# Patient Record
Sex: Female | Born: 1950
Health system: Southern US, Community
[De-identification: ages and names within clinical notes are randomized; demographics above are authoritative.]

## PROBLEM LIST (undated history)

## (undated) DIAGNOSIS — I10 Essential (primary) hypertension: Secondary | ICD-10-CM

---

## 2004-08-30 ENCOUNTER — Ambulatory Visit: Payer: Self-pay | Admitting: Obstetrics and Gynecology

## 2008-08-10 ENCOUNTER — Ambulatory Visit: Payer: Self-pay | Admitting: Obstetrics and Gynecology

## 2008-08-13 ENCOUNTER — Ambulatory Visit: Payer: Self-pay | Admitting: Obstetrics and Gynecology

## 2009-08-25 ENCOUNTER — Ambulatory Visit: Payer: Self-pay | Admitting: Obstetrics and Gynecology

## 2010-08-29 ENCOUNTER — Ambulatory Visit: Payer: Self-pay | Admitting: Obstetrics and Gynecology

## 2011-08-31 ENCOUNTER — Ambulatory Visit: Payer: Self-pay | Admitting: Obstetrics and Gynecology

## 2012-09-16 ENCOUNTER — Ambulatory Visit: Payer: Self-pay | Admitting: Obstetrics and Gynecology

## 2013-09-29 ENCOUNTER — Ambulatory Visit: Payer: Self-pay | Admitting: Obstetrics and Gynecology

## 2013-10-05 ENCOUNTER — Ambulatory Visit: Payer: Self-pay | Admitting: Obstetrics and Gynecology

## 2014-10-12 ENCOUNTER — Ambulatory Visit: Payer: Self-pay | Admitting: Internal Medicine

## 2015-03-03 ENCOUNTER — Observation Stay
Admit: 2015-03-03 | Discharge: 2015-03-03 | Disposition: A | Discharge status: Home / Self Care | Payer: PPO | Attending: Internal Medicine | Admitting: Internal Medicine

## 2015-03-03 ENCOUNTER — Encounter: Payer: Self-pay | Admitting: Emergency Medicine

## 2015-03-03 ENCOUNTER — Observation Stay
Admission: EM | Admit: 2015-03-03 | Discharge: 2015-03-04 | Disposition: A | Discharge status: Home / Self Care | Payer: PPO | Attending: Internal Medicine | Admitting: Internal Medicine

## 2015-03-03 ENCOUNTER — Emergency Department: Payer: PPO

## 2015-03-03 DIAGNOSIS — R4701 Aphasia: Secondary | ICD-10-CM | POA: Insufficient documentation

## 2015-03-03 DIAGNOSIS — R200 Anesthesia of skin: Secondary | ICD-10-CM | POA: Diagnosis not present

## 2015-03-03 DIAGNOSIS — G459 Transient cerebral ischemic attack, unspecified: Secondary | ICD-10-CM | POA: Diagnosis present

## 2015-03-03 DIAGNOSIS — I739 Peripheral vascular disease, unspecified: Secondary | ICD-10-CM | POA: Insufficient documentation

## 2015-03-03 DIAGNOSIS — Z885 Allergy status to narcotic agent status: Secondary | ICD-10-CM | POA: Diagnosis not present

## 2015-03-03 DIAGNOSIS — M1990 Unspecified osteoarthritis, unspecified site: Secondary | ICD-10-CM | POA: Diagnosis not present

## 2015-03-03 DIAGNOSIS — I639 Cerebral infarction, unspecified: Secondary | ICD-10-CM | POA: Diagnosis present

## 2015-03-03 DIAGNOSIS — Z791 Long term (current) use of non-steroidal anti-inflammatories (NSAID): Secondary | ICD-10-CM | POA: Insufficient documentation

## 2015-03-03 DIAGNOSIS — R51 Headache: Secondary | ICD-10-CM | POA: Diagnosis not present

## 2015-03-03 DIAGNOSIS — R7309 Other abnormal glucose: Secondary | ICD-10-CM | POA: Diagnosis not present

## 2015-03-03 DIAGNOSIS — I6782 Cerebral ischemia: Principal | ICD-10-CM | POA: Insufficient documentation

## 2015-03-03 DIAGNOSIS — R29810 Facial weakness: Secondary | ICD-10-CM | POA: Diagnosis not present

## 2015-03-03 DIAGNOSIS — R4781 Slurred speech: Secondary | ICD-10-CM | POA: Diagnosis not present

## 2015-03-03 DIAGNOSIS — I10 Essential (primary) hypertension: Secondary | ICD-10-CM | POA: Diagnosis not present

## 2015-03-03 HISTORY — DX: Essential (primary) hypertension: I10

## 2015-03-03 LAB — DIFFERENTIAL
BASOS ABS: 0.1 10*3/uL (ref 0–0.1)
BASOS PCT: 1 %
Eosinophils Absolute: 0.2 10*3/uL (ref 0–0.7)
Eosinophils Relative: 3 %
Lymphocytes Relative: 24 %
Lymphs Abs: 1.7 10*3/uL (ref 1.0–3.6)
MONOS PCT: 7 %
Monocytes Absolute: 0.5 10*3/uL (ref 0.2–0.9)
NEUTROS PCT: 65 %
Neutro Abs: 4.7 10*3/uL (ref 1.4–6.5)

## 2015-03-03 LAB — LIPID PANEL
Cholesterol: 251 mg/dL — ABNORMAL HIGH (ref 0–200)
HDL: 48 mg/dL (ref >40)
LDL Cholesterol: 175 mg/dL — ABNORMAL HIGH (ref 0–99)
TRIGLYCERIDES: 139 mg/dL (ref <150)
Total CHOL/HDL Ratio: 5.2 RATIO
VLDL: 28 mg/dL (ref 0–40)

## 2015-03-03 LAB — CBC
HEMATOCRIT: 42 % (ref 35.0–47.0)
Hemoglobin: 14 g/dL (ref 12.0–16.0)
MCH: 28 pg (ref 26.0–34.0)
MCHC: 33.5 g/dL (ref 32.0–36.0)
MCV: 83.8 fL (ref 80.0–100.0)
Platelets: 188 10*3/uL (ref 150–440)
RBC: 5.01 MIL/uL (ref 3.80–5.20)
RDW: 13.8 % (ref 11.5–14.5)
WBC: 7.1 10*3/uL (ref 3.6–11.0)

## 2015-03-03 LAB — PROTIME-INR
INR: 1.04
Prothrombin Time: 13.8 seconds (ref 11.4–15.0)

## 2015-03-03 LAB — COMPREHENSIVE METABOLIC PANEL
ALT: 30 U/L (ref 14–54)
AST: 25 U/L (ref 15–41)
Albumin: 4.3 g/dL (ref 3.5–5.0)
Alkaline Phosphatase: 87 U/L (ref 38–126)
Anion gap: 9 (ref 5–15)
BUN: 21 mg/dL — ABNORMAL HIGH (ref 6–20)
CALCIUM: 9.9 mg/dL (ref 8.9–10.3)
CO2: 30 mmol/L (ref 22–32)
Chloride: 101 mmol/L (ref 101–111)
Creatinine, Ser: 1.27 mg/dL — ABNORMAL HIGH (ref 0.44–1.00)
GFR calc Af Amer: 50 mL/min — ABNORMAL LOW (ref >60)
GFR calc non Af Amer: 43 mL/min — ABNORMAL LOW (ref >60)
Glucose, Bld: 131 mg/dL — ABNORMAL HIGH (ref 65–99)
Potassium: 3.8 mmol/L (ref 3.5–5.1)
SODIUM: 140 mmol/L (ref 135–145)
TOTAL PROTEIN: 7.4 g/dL (ref 6.5–8.1)
Total Bilirubin: 0.8 mg/dL (ref 0.3–1.2)

## 2015-03-03 LAB — TROPONIN I: Troponin I: <0.03 ng/mL (ref <0.031)

## 2015-03-03 LAB — APTT: APTT: 33 s (ref 24–36)

## 2015-03-03 MED ORDER — SODIUM CHLORIDE 0.9 % IJ SOLN
3.0000 mL | Freq: Two times a day (BID) | INTRAMUSCULAR | Status: DC
  Administered 2015-03-03 – 2015-03-04 (×2): 3 mL via INTRAVENOUS

## 2015-03-03 MED ORDER — PNEUMOCOCCAL VAC POLYVALENT 25 MCG/0.5ML IJ INJ
0.5000 mL | INJECTION | INTRAMUSCULAR | Status: DC
  Filled 2015-03-03: qty 0.5

## 2015-03-03 MED ORDER — ASPIRIN EC 81 MG PO TBEC: 81.0000 mg | DELAYED_RELEASE_TABLET | Freq: Every day | ORAL | Status: DC

## 2015-03-03 MED ORDER — ASPIRIN 81 MG PO CHEW
324.0000 mg | CHEWABLE_TABLET | Freq: Once | ORAL | Status: AC
  Administered 2015-03-03: 12:00:00 via ORAL

## 2015-03-03 MED ORDER — SPIRONOLACTONE-HCTZ 25-25 MG PO TABS: 1.0000 | ORAL_TABLET | Freq: Every day | ORAL | Status: DC

## 2015-03-03 MED ORDER — SENNOSIDES-DOCUSATE SODIUM 8.6-50 MG PO TABS
1.0000 | ORAL_TABLET | Freq: Every evening | ORAL | Status: DC | PRN
Start: 2015-03-03 — End: 2015-03-04

## 2015-03-03 MED ORDER — HEPARIN SODIUM (PORCINE) 5000 UNIT/ML IJ SOLN: 5000.0000 [IU] | Freq: Three times a day (TID) | INTRAMUSCULAR | Status: DC

## 2015-03-03 MED ORDER — ACETAMINOPHEN 650 MG RE SUPP: 650.0000 mg | Freq: Four times a day (QID) | RECTAL | Status: DC | PRN

## 2015-03-03 MED ORDER — ACETAMINOPHEN 325 MG PO TABS: 650.0000 mg | ORAL_TABLET | Freq: Four times a day (QID) | ORAL | Status: DC | PRN

## 2015-03-03 MED ORDER — SODIUM CHLORIDE 0.9 % IJ SOLN
3.0000 mL | Freq: Two times a day (BID) | INTRAMUSCULAR | Status: DC
  Administered 2015-03-03: 3 mL via INTRAVENOUS

## 2015-03-03 MED ORDER — SIMVASTATIN 20 MG PO TABS: 20.0000 mg | ORAL_TABLET | Freq: Every day | ORAL | Status: DC

## 2015-03-03 MED ORDER — SPIRONOLACTONE 25 MG PO TABS
25.0000 mg | ORAL_TABLET | Freq: Every day | ORAL | Status: DC
  Administered 2015-03-04: 25 mg via ORAL
  Filled 2015-03-03: qty 1

## 2015-03-03 MED ORDER — ONDANSETRON HCL 4 MG PO TABS: 4.0000 mg | ORAL_TABLET | Freq: Four times a day (QID) | ORAL | Status: DC | PRN

## 2015-03-03 MED ORDER — HEPARIN SODIUM (PORCINE) 5000 UNIT/ML IJ SOLN
5000.0000 [IU] | Freq: Three times a day (TID) | INTRAMUSCULAR | Status: DC
  Administered 2015-03-03 – 2015-03-04 (×3): 5000 [IU] via SUBCUTANEOUS
  Filled 2015-03-03 (×3): qty 1

## 2015-03-03 MED ORDER — ASPIRIN 81 MG PO CHEW
CHEWABLE_TABLET | ORAL | Status: AC
  Filled 2015-03-03: qty 4

## 2015-03-03 MED ORDER — HYDROCHLOROTHIAZIDE 25 MG PO TABS
25.0000 mg | ORAL_TABLET | Freq: Every day | ORAL | Status: DC
  Administered 2015-03-04: 25 mg via ORAL
  Filled 2015-03-03: qty 1

## 2015-03-03 MED ORDER — ONDANSETRON HCL 4 MG/2ML IJ SOLN: 4.0000 mg | Freq: Four times a day (QID) | INTRAMUSCULAR | Status: DC | PRN

## 2015-03-03 MED ORDER — INSULIN ASPART 100 UNIT/ML ~~LOC~~ SOLN
0.0000 [IU] | Freq: Three times a day (TID) | SUBCUTANEOUS | Status: DC
  Filled 2015-03-03: qty 2

## 2015-03-03 MED ORDER — SENNOSIDES-DOCUSATE SODIUM 8.6-50 MG PO TABS: 1.0000 | ORAL_TABLET | Freq: Every evening | ORAL | Status: DC | PRN

## 2015-03-03 MED ORDER — ASPIRIN EC 81 MG PO TBEC
81.0000 mg | DELAYED_RELEASE_TABLET | Freq: Every day | ORAL | Status: DC
  Administered 2015-03-04: 81 mg via ORAL
  Filled 2015-03-03: qty 1

## 2015-03-03 NOTE — ED Provider Notes (Signed)
[facility] Emergency Department Provider Note  ____________________________________________  Time seen: Approximately 11:01 AM  I have reviewed the triage vital signs and the nursing notes.   HISTORY  Chief Complaint Cerebrovascular Accident    HPI KEMA SANTAELLA is a 64 y.o. female with history of hypertension presents for evaluation of word-finding difficulties and left facial droop which began suddenly at 9:30 this morning. She also reported facial numbness. No weakness in the extremities. Currently her symptoms are improving. Initial severity 10 out of 10.No recent illness including no cough, sneezing, runny nose, congestion. No chest pain or difficulty breathing. No modifying factors.   Past Medical History  Diagnosis Date  . Hypertension     There are no active problems to display for this patient.   History reviewed. No pertinent past surgical history.  Current Outpatient Rx  Name  Route  Sig  Dispense  Refill  . ibuprofen (ADVIL,MOTRIN) 800 MG tablet   Oral   Take 800 mg by mouth every 8 (eight) hours as needed.         . meloxicam (MOBIC) 7.5 MG tablet   Oral   Take 7.5 mg by mouth daily.         Marland Kitchen spironolactone-hydrochlorothiazide (ALDACTAZIDE) 25-25 MG per tablet   Oral   Take 1 tablet by mouth daily.           Allergies Codeine  History reviewed. No pertinent family history.  Social History History  Substance Use Topics  . Smoking status: Never Smoker   . Smokeless tobacco: Not on file  . Alcohol Use: No    Review of Systems Constitutional: No fever/chills Eyes: No visual changes. ENT: No sore throat. Cardiovascular: Denies chest pain. Respiratory: Denies shortness of breath. Gastrointestinal: No abdominal pain.  No nausea, no vomiting.  No diarrhea.  No constipation. Genitourinary: Negative for dysuria. Musculoskeletal: Negative for back pain. Skin: Negative for rash. Neurological: + for mild  headache  10-point ROS otherwise negative.  ____________________________________________   PHYSICAL EXAM:  VITAL SIGNS: ED Triage Vitals  Enc Vitals Group     BP 03/03/15 1013 157/81 mmHg     Pulse Rate 03/03/15 1013 61     Resp 03/03/15 1013 18     Temp --      Temp src --      SpO2 03/03/15 1013 99 %     Weight 03/03/15 1013 220 lb (99.791 kg)     Height 03/03/15 1013 5\' 1"  (1.549 m)     Head Cir --      Peak Flow --      Pain Score 03/03/15 1015 3     Pain Loc --      Pain Edu? --      Excl. in [place]? --     Constitutional: Alert and oriented. Well appearing and in no acute distress. Eyes: Conjunctivae are normal. PERRL. EOMI. Head: Atraumatic. Nose: No congestion/rhinnorhea. Mouth/Throat: Mucous membranes are moist.  Oropharynx non-erythematous. Neck: No stridor.   Hematological/Lymphatic/Immunilogical: No cervical lymphadenopathy. Cardiovascular: Normal rate, regular rhythm. Grossly normal heart sounds.  Good peripheral circulation. Respiratory: Normal respiratory effort.  No retractions. Lungs CTAB. Gastrointestinal: Soft and nontender. No distention. No abdominal bruits. No CVA tenderness. Genitourinary: deferred Musculoskeletal: No lower extremity tenderness nor edema.  No joint effusions. Neurologic:  Normal speech and language.  Speech is normal. 5 out of 5 strength in bilateral upper and lower extremities, sensation intact to light touch throughout, normal finger-nose-finger, no aphasia, minimal  left lower facial droop/facial asymmetry but cranial nerves otherwise intact Skin:  Skin is warm, dry and intact. No rash noted. Psychiatric: Mood and affect are normal. Speech and behavior are normal.  ____________________________________________   LABS (all labs ordered are listed, but only abnormal results are displayed)  Labs Reviewed  COMPREHENSIVE METABOLIC PANEL - Abnormal; Notable for the following:    Glucose, Bld 131 (*)    BUN 21 (*)    Creatinine, Ser  1.27 (*)    GFR calc non Af Amer 43 (*)    GFR calc Af Amer 50 (*)    All other components within normal limits  PROTIME-INR  APTT  CBC  DIFFERENTIAL  TROPONIN I   ____________________________________________  EKG  ED ECG REPORT   Date: 03/03/2015  EKG Time: 10:19  Rate: 58  Rhythm: Sinus bradycardia  Axis: Normal  Intervals:none  ST&T Change: None  ____________________________________________  RADIOLOGY  CT head IMPRESSION: 1. No evidence of acute intracranial abnormality. 2. Mild chronic small vessel ischemic disease. ____________________________________________   PROCEDURES  Procedure(s) performed: None  Critical Care performed: Yes. Total critical care time spent 40 minutes.  ____________________________________________   INITIAL IMPRESSION / ASSESSMENT AND PLAN / ED COURSE  Pertinent labs & imaging results that were available during my care of the patient were reviewed by me and considered in my medical decision making (see chart for details).  BELKIS NORBECK is a 64 y.o. female with history of hypertension presents for evaluation of word-finding difficulties and left facial droop which began suddenly at 9:30 this morning. At this point, this patient's speech difficulty is completely resolved, she is completely coherent, speaking with normal fluency. She does continue to have mild left lower facial droop/facial asymmetry but given NIH stroke scale of one, rapid improvement in symptoms, discussed with Dr. Tamala Julian of neurology who agrees we will not administer TPA at this time. Plan for basic labs and anticipate admission.   ----------------------------------------- 12:29 PM on 03/03/2015 -----------------------------------------  CT head negative. Patient passed her swallow screen. Aspirin ordered. Still with very mild left facial droop. Hospitalist will admit. ____________________________________________   FINAL CLINICAL IMPRESSION(S) / ED  DIAGNOSES  Final diagnoses:  CVA (cerebral vascular accident)      Joanne Gavel, MD 03/03/15 1446

## 2015-03-03 NOTE — H&P (Signed)
[facility] [place] NAME: Michele Rios    MR#:  [id number]  DATE OF BIRTH:  05/08/1951  DATE OF ADMISSION:  03/03/2015 PRIMARY CARE PHYSICIAN:Dr Emily Filbert   REQUESTING/REFERRING PHYSICIAN: Dr. Edd Fabian  CHIEF COMPLAINT:  Slurred speech and facial droop HISTORY OF PRESENT ILLNESS:  Michele Rios  is a 64 y.o. female with a known history of essential hypertension who presents today with above complaint. According to the patient she was outside doing yard work and then came inside to eat lunch. She went to answer the phone and as she was speaking she noticed that she was unable to get her words out and her sensorium was cloudy. Her family noted slurred speech as well as left facial droop. She's also noticed a dull headache over the past several days. She denies any vision changes or any focal deficits. Her symptoms had subsided prior to coming to the emergency room. Emergency department had CAT scan was performed which was negative for an acute stroke.  PAST MEDICAL HISTORY:   Past Medical History  Diagnosis Date  . Hypertension     PAST SURGICAL HISTOIRY:  History reviewed. No pertinent past surgical history.  SOCIAL HISTORY:   History  Substance Use Topics  . Smoking status: Never Smoker   . Smokeless tobacco: Not on file  . Alcohol Use: No    FAMILY HISTORY:  History reviewed. No pertinent family history.  DRUG ALLERGIES:   Allergies  Allergen Reactions  . Codeine Nausea And Vomiting    REVIEW OF SYSTEMS:  CONSTITUTIONAL: No fever, fatigue or weakness.  EYES: No blurred or double vision.  EARS, NOSE, AND THROAT: No tinnitus or ear pain.  RESPIRATORY: No cough, shortness of breath, wheezing or hemoptysis.  CARDIOVASCULAR: No chest pain, orthopnea, edema.  GASTROINTESTINAL: No nausea, vomiting, diarrhea or abdominal pain.  GENITOURINARY: No dysuria, hematuria.  ENDOCRINE: No polyuria, nocturia,  HEMATOLOGY: No  anemia, easy bruising or bleeding SKIN: No rash or lesion. MUSCULOSKELETAL: No joint pain or arthritis.   NEUROLOGIC: She had tingling around her mouth along with slurred speech and facial droop which is now resolved. She also had a dull headache for several days. PSYCHIATRY: No anxiety or depression.   MEDICATIONS AT HOME:   Prior to Admission medications   Medication Sig Start Date End Date Taking? Authorizing Provider  ibuprofen (ADVIL,MOTRIN) 800 MG tablet Take 800 mg by mouth every 8 (eight) hours as needed.   Yes Historical Provider, MD  meloxicam (MOBIC) 7.5 MG tablet Take 7.5 mg by mouth daily.   Yes Historical Provider, MD  spironolactone-hydrochlorothiazide (ALDACTAZIDE) 25-25 MG per tablet Take 1 tablet by mouth daily.   Yes Historical Provider, MD      VITAL SIGNS:  Blood pressure 114/52, pulse 58, resp. rate 12, height 5\' 1"  (1.549 m), weight 99.791 kg (220 lb), SpO2 100 %.  PHYSICAL EXAMINATION:  GENERAL:  64 y.o.-year-old patient lying in the bed with no acute distress.  EYES: Pupils equal, round, reactive to light and accommodation. No scleral icterus. Extraocular muscles intact.  HEENT: Head atraumatic, normocephalic. Oropharynx and nasopharynx clear.  NECK:  Supple, no jugular venous distention. No thyroid enlargement, no tenderness.  LUNGS: Normal breath sounds bilaterally, no wheezing, rales,rhonchi or crepitation. No use of accessory muscles of respiration.  CARDIOVASCULAR: S1, S2 normal. No murmurs, rubs, or gallops.  ABDOMEN: Soft, nontender, nondistended. Bowel sounds present. No organomegaly or mass.  EXTREMITIES: No pedal edema, cyanosis, or clubbing.  NEUROLOGIC: Cranial nerves II through XII are intact. Muscle strength 5/5 in all extremities. Sensation intact. Gait not checked.  PSYCHIATRIC: The patient is alert and oriented x 3.  SKIN: No obvious rash, lesion, or ulcer.   LABORATORY PANEL:   CBC  Recent Labs Lab 03/03/15 1033  WBC 7.1  HGB 14.0   HCT 42.0  PLT 188   ------------------------------------------------------------------------------------------------------------------  Chemistries   Recent Labs Lab 03/03/15 1033  NA 140  K 3.8  CL 101  CO2 30  GLUCOSE 131*  BUN 21*  CREATININE 1.27*  CALCIUM 9.9  AST 25  ALT 30  ALKPHOS 87  BILITOT 0.8   ------------------------------------------------------------------------------------------------------------------  Cardiac Enzymes  Recent Labs Lab 03/03/15 1023  TROPONINI <0.03   ------------------------------------------------------------------------------------------------------------------  RADIOLOGY:  Ct Head Wo Contrast  03/03/2015  .  IMPRESSION: 1. No evidence of acute intracranial abnormality. 2. Mild chronic small vessel ischemic disease.   Electronically Signed   By: Logan Bores   On: 03/03/2015 11:30    EKG:   Sinus brady HR 58 no st elevations  IMPRESSION AND PLAN:  This is 64 year old female who presents with slurred speech left facial droop and inability to speak and now all of her symptoms have resolved.   1. TIA: Patient's symptoms are likely secondary to TIA. Her symptoms have now subsided. Patient is aspirin nave. I will start aspirin and statin therapy. I will check fasting lipids in a.m. Neuro checks will be ordered every 4 hours. I have ordered an MRI, echocardiogram, and carotid Dopplers as part of the stroke protocol. Further management depending upon these results. At this time patient does not need speech therapy or physical therapy as her symptoms have resolved.  2. Essential hypertension: Patient will continue on outpatient occasions.   3. Prediabetes: I will place her on SSI and ADA diet.  All the records are reviewed and case discussed with ED provider. Management plans discussed with the patient and family and they are in agreement.  CODE STATUS: FULL   TOTAL TIME TAKING CARE OF THIS PATIENT: 45 minutes.    Amanuel Sinkfield,  Petronella Shuford M.D on 03/03/2015 at 12:51 PM  Between 7am to 6pm - Pager - [phone] After 6pm go to [email or web address] - password [email or web address] [facility]  [facility]  Office  [phone]  CC: Primary care physician; No primary care provider on file.

## 2015-03-03 NOTE — ED Notes (Signed)
MD in with pt

## 2015-03-03 NOTE — ED Notes (Signed)
Patient resting comfortably on stretcher. Denies any symptoms. Alert and oriented. Family at bedside. Awaiting room assignment.

## 2015-03-03 NOTE — ED Notes (Signed)
Patient transported to CT with RN 

## 2015-03-03 NOTE — ED Notes (Signed)
States her speech became slurred and she felt funny..face is numbness and headche

## 2015-03-03 NOTE — ED Notes (Signed)
Pt returned from CT, family at bedside.

## 2015-03-03 NOTE — ED Notes (Signed)
Pt tearful at bedside, family in room

## 2015-03-04 ENCOUNTER — Observation Stay: Payer: PPO

## 2015-03-04 LAB — BASIC METABOLIC PANEL
Anion gap: 8 (ref 5–15)
BUN: 23 mg/dL — ABNORMAL HIGH (ref 6–20)
CO2: 30 mmol/L (ref 22–32)
CREATININE: 1.24 mg/dL — AB (ref 0.44–1.00)
Calcium: 9.6 mg/dL (ref 8.9–10.3)
Chloride: 102 mmol/L (ref 101–111)
GFR, EST AFRICAN AMERICAN: 52 mL/min — AB (ref >60)
GFR, EST NON AFRICAN AMERICAN: 45 mL/min — AB (ref >60)
GLUCOSE: 120 mg/dL — AB (ref 65–99)
Potassium: 3.7 mmol/L (ref 3.5–5.1)
Sodium: 140 mmol/L (ref 135–145)

## 2015-03-04 LAB — GLUCOSE, CAPILLARY
GLUCOSE-CAPILLARY: 119 mg/dL — AB (ref 65–99)
Glucose-Capillary: 160 mg/dL — ABNORMAL HIGH (ref 65–99)

## 2015-03-04 MED ORDER — SIMVASTATIN 40 MG PO TABS: 40.0000 mg | ORAL_TABLET | Freq: Every day | ORAL | Status: AC

## 2015-03-04 MED ORDER — CLOPIDOGREL BISULFATE 75 MG PO TABS: 75.0000 mg | ORAL_TABLET | Freq: Every day | ORAL | Status: AC

## 2015-03-04 MED ORDER — ASPIRIN 81 MG PO TBEC: 81.0000 mg | DELAYED_RELEASE_TABLET | Freq: Every day | ORAL | Status: DC

## 2015-03-04 MED ORDER — SIMVASTATIN 40 MG PO TABS: 40.0000 mg | ORAL_TABLET | Freq: Every day | ORAL | Status: DC

## 2015-03-04 NOTE — Discharge Instructions (Signed)
No heavy lifting for 1 month

## 2015-03-04 NOTE — Progress Notes (Signed)
A&O. Denies pain. Independent. Denies any unilateral weakness. No s/s distress. Passed swallow eval. For MRI this AM.

## 2015-03-04 NOTE — Discharge Summary (Addendum)
                                                                                    Michele Rios, is a 64 y.o. female  DOB 1951-10-14  MRN [id number].  Admission date:  03/03/2015  Admitting Physician  Bettey Costa, MD  Discharge Date:  03/04/2015    Admission Diagnosis  CVA (cerebral vascular accident) [I63.9]  Discharge Diagnoses   Right PCA watershed infarcts Hyperlipidemia Osteoarthritis   Past Medical History  Diagnosis Date  . Hypertension     History reviewed. No pertinent past surgical history.     History of present illness and  Hospital Course:     Kindly see H&P for history of present illness and admission details, please review complete Labs, Consult reports and Test reports for all details in brief  HPI  from the history and physical done on the day of admission    Hospital Course    Patient was admitted with left facial droop, expressive aphasia and clouded sensorium. Initial brain CT showed no evidence of CVA. Blood work normal except LDL of 175 on lipid panel. Echocardiogram normal. Patient's symptoms resolved within 4 hours of presentation to the ER and she is currently asymptomatic. MRI showed 3 watershed infarcts in the right posterior cerebral perfusion area. Carotid shows no sig stenosis and Echocardiogram was normal. Telemetry showed only NSR.   Discharge Condition: Stable   Follow UP  Dr. Sabra Heck one week    Discharge Instructions  and  Discharge Medications   Plavix 75 mg daily Simvastatin 40 mg at bedtime Spironolactone/HCTZ 25/25 daily  Today   Subjective:   Michele Rios is asymptomatic post MRI  Objective:   Blood pressure 130/36, pulse 60, temperature 97.9 F (36.6 C), temperature source Oral, resp. rate 18, height 5' (1.524 m), weight 98.068 kg (216 lb 3.2 oz), SpO2 99 %.   Exam Awake Alert, Oriented x 3, No new F.N deficits, Normal affect [place].AT,PERRALNo neurologic deficits   Total Time in preparing paper work, data  evaluation and todays exam - 35 minutes  [place] F. M.D on 03/04/2015 at 8:08 AM

## 2015-03-17 ENCOUNTER — Other Ambulatory Visit: Payer: Self-pay | Admitting: Neurology

## 2015-03-17 DIAGNOSIS — I63439 Cerebral infarction due to embolism of unspecified posterior cerebral artery: Secondary | ICD-10-CM

## 2015-03-22 ENCOUNTER — Ambulatory Visit
Admission: RE | Admit: 2015-03-22 | Discharge: 2015-03-22 | Disposition: A | Discharge status: Home / Self Care | Payer: PPO | Source: Ambulatory Visit | Attending: Neurology | Admitting: Neurology

## 2015-03-22 DIAGNOSIS — I63439 Cerebral infarction due to embolism of unspecified posterior cerebral artery: Secondary | ICD-10-CM

## 2015-03-22 DIAGNOSIS — I671 Cerebral aneurysm, nonruptured: Secondary | ICD-10-CM | POA: Diagnosis not present

## 2015-03-25 ENCOUNTER — Ambulatory Visit: Payer: PPO

## 2015-09-13 ENCOUNTER — Other Ambulatory Visit: Payer: Self-pay | Admitting: Obstetrics and Gynecology

## 2015-09-13 DIAGNOSIS — Z1231 Encounter for screening mammogram for malignant neoplasm of breast: Secondary | ICD-10-CM

## 2015-09-14 ENCOUNTER — Ambulatory Visit: Payer: PPO | Attending: Obstetrics and Gynecology

## 2015-10-12 ENCOUNTER — Ambulatory Visit
Admission: RE | Admit: 2015-10-12 | Discharge: 2015-10-12 | Disposition: A | Discharge status: Home / Self Care | Payer: PPO | Source: Ambulatory Visit | Attending: Obstetrics and Gynecology | Admitting: Obstetrics and Gynecology

## 2015-10-12 ENCOUNTER — Other Ambulatory Visit: Payer: Self-pay | Admitting: Obstetrics and Gynecology

## 2015-10-12 DIAGNOSIS — Z1231 Encounter for screening mammogram for malignant neoplasm of breast: Secondary | ICD-10-CM

## 2016-03-12 ENCOUNTER — Other Ambulatory Visit: Payer: Self-pay | Admitting: Neurology

## 2016-03-12 DIAGNOSIS — I63419 Cerebral infarction due to embolism of unspecified middle cerebral artery: Secondary | ICD-10-CM

## 2016-03-12 DIAGNOSIS — I671 Cerebral aneurysm, nonruptured: Secondary | ICD-10-CM

## 2016-03-30 ENCOUNTER — Ambulatory Visit
Admission: RE | Admit: 2016-03-30 | Discharge: 2016-03-30 | Disposition: A | Discharge status: Home / Self Care | Payer: PPO | Source: Ambulatory Visit | Attending: Neurology | Admitting: Neurology

## 2016-03-30 DIAGNOSIS — I63419 Cerebral infarction due to embolism of unspecified middle cerebral artery: Secondary | ICD-10-CM

## 2016-03-30 DIAGNOSIS — I671 Cerebral aneurysm, nonruptured: Secondary | ICD-10-CM

## 2016-04-05 DIAGNOSIS — Z0000 Encounter for general adult medical examination without abnormal findings: Secondary | ICD-10-CM | POA: Diagnosis not present

## 2016-04-05 DIAGNOSIS — E119 Type 2 diabetes mellitus without complications: Secondary | ICD-10-CM | POA: Diagnosis not present

## 2016-04-12 DIAGNOSIS — E119 Type 2 diabetes mellitus without complications: Secondary | ICD-10-CM | POA: Diagnosis not present

## 2016-04-30 ENCOUNTER — Other Ambulatory Visit: Payer: Self-pay | Admitting: Orthopedic Surgery

## 2016-04-30 ENCOUNTER — Ambulatory Visit
Admission: RE | Admit: 2016-04-30 | Discharge: 2016-04-30 | Disposition: A | Discharge status: Home / Self Care | Payer: PPO | Source: Ambulatory Visit | Attending: Orthopedic Surgery | Admitting: Orthopedic Surgery

## 2016-04-30 ENCOUNTER — Other Ambulatory Visit: Payer: Self-pay | Admitting: Internal Medicine

## 2016-04-30 DIAGNOSIS — M2392 Unspecified internal derangement of left knee: Secondary | ICD-10-CM

## 2016-04-30 DIAGNOSIS — M25562 Pain in left knee: Secondary | ICD-10-CM | POA: Diagnosis not present

## 2016-08-03 DIAGNOSIS — Z23 Encounter for immunization: Secondary | ICD-10-CM | POA: Diagnosis not present

## 2016-09-03 ENCOUNTER — Other Ambulatory Visit: Payer: Self-pay | Admitting: Internal Medicine

## 2016-09-03 DIAGNOSIS — Z1231 Encounter for screening mammogram for malignant neoplasm of breast: Secondary | ICD-10-CM

## 2016-10-11 ENCOUNTER — Ambulatory Visit: Payer: PPO

## 2016-10-12 DIAGNOSIS — E119 Type 2 diabetes mellitus without complications: Secondary | ICD-10-CM | POA: Diagnosis not present

## 2016-10-16 ENCOUNTER — Ambulatory Visit
Admission: RE | Admit: 2016-10-16 | Discharge: 2016-10-16 | Disposition: A | Discharge status: Home / Self Care | Payer: PPO | Source: Ambulatory Visit | Attending: Internal Medicine | Admitting: Internal Medicine

## 2016-10-16 DIAGNOSIS — Z1231 Encounter for screening mammogram for malignant neoplasm of breast: Secondary | ICD-10-CM | POA: Insufficient documentation

## 2016-10-19 DIAGNOSIS — I63431 Cerebral infarction due to embolism of right posterior cerebral artery: Secondary | ICD-10-CM | POA: Diagnosis not present

## 2016-10-19 DIAGNOSIS — R7989 Other specified abnormal findings of blood chemistry: Secondary | ICD-10-CM | POA: Diagnosis not present

## 2016-10-19 DIAGNOSIS — Z0000 Encounter for general adult medical examination without abnormal findings: Secondary | ICD-10-CM | POA: Diagnosis not present

## 2016-10-19 DIAGNOSIS — E119 Type 2 diabetes mellitus without complications: Secondary | ICD-10-CM | POA: Diagnosis not present

## 2017-03-28 ENCOUNTER — Other Ambulatory Visit: Payer: Self-pay | Admitting: Internal Medicine

## 2017-03-28 DIAGNOSIS — I671 Cerebral aneurysm, nonruptured: Secondary | ICD-10-CM

## 2017-04-09 DIAGNOSIS — E119 Type 2 diabetes mellitus without complications: Secondary | ICD-10-CM | POA: Diagnosis not present

## 2017-04-09 DIAGNOSIS — Z0000 Encounter for general adult medical examination without abnormal findings: Secondary | ICD-10-CM | POA: Diagnosis not present

## 2017-04-09 DIAGNOSIS — R7989 Other specified abnormal findings of blood chemistry: Secondary | ICD-10-CM | POA: Diagnosis not present

## 2017-04-12 ENCOUNTER — Ambulatory Visit
Admission: RE | Admit: 2017-04-12 | Discharge: 2017-04-12 | Disposition: A | Discharge status: Home / Self Care | Payer: PPO | Source: Ambulatory Visit | Attending: Internal Medicine | Admitting: Internal Medicine

## 2017-04-12 DIAGNOSIS — I671 Cerebral aneurysm, nonruptured: Secondary | ICD-10-CM | POA: Diagnosis not present

## 2017-04-12 DIAGNOSIS — I672 Cerebral atherosclerosis: Secondary | ICD-10-CM | POA: Diagnosis not present

## 2017-04-16 DIAGNOSIS — Z0000 Encounter for general adult medical examination without abnormal findings: Secondary | ICD-10-CM | POA: Diagnosis not present

## 2017-04-16 DIAGNOSIS — R7989 Other specified abnormal findings of blood chemistry: Secondary | ICD-10-CM | POA: Diagnosis not present

## 2017-04-16 DIAGNOSIS — E119 Type 2 diabetes mellitus without complications: Secondary | ICD-10-CM | POA: Diagnosis not present

## 2017-04-16 DIAGNOSIS — E782 Mixed hyperlipidemia: Secondary | ICD-10-CM | POA: Diagnosis not present

## 2017-08-01 DIAGNOSIS — Z23 Encounter for immunization: Secondary | ICD-10-CM | POA: Diagnosis not present

## 2017-09-11 ENCOUNTER — Other Ambulatory Visit: Payer: Self-pay | Admitting: Internal Medicine

## 2017-09-11 DIAGNOSIS — Z1231 Encounter for screening mammogram for malignant neoplasm of breast: Secondary | ICD-10-CM

## 2017-10-17 DIAGNOSIS — E119 Type 2 diabetes mellitus without complications: Secondary | ICD-10-CM | POA: Diagnosis not present

## 2017-10-17 DIAGNOSIS — E782 Mixed hyperlipidemia: Secondary | ICD-10-CM | POA: Diagnosis not present

## 2017-10-18 ENCOUNTER — Ambulatory Visit
Admission: RE | Admit: 2017-10-18 | Discharge: 2017-10-18 | Disposition: A | Discharge status: Home / Self Care | Payer: PPO | Source: Ambulatory Visit | Attending: Internal Medicine | Admitting: Internal Medicine

## 2017-10-18 DIAGNOSIS — Z1231 Encounter for screening mammogram for malignant neoplasm of breast: Secondary | ICD-10-CM | POA: Insufficient documentation

## 2017-10-21 DIAGNOSIS — Z0000 Encounter for general adult medical examination without abnormal findings: Secondary | ICD-10-CM | POA: Diagnosis not present

## 2017-10-21 DIAGNOSIS — E782 Mixed hyperlipidemia: Secondary | ICD-10-CM | POA: Diagnosis not present

## 2017-10-21 DIAGNOSIS — E119 Type 2 diabetes mellitus without complications: Secondary | ICD-10-CM | POA: Diagnosis not present

## 2017-10-21 DIAGNOSIS — M818 Other osteoporosis without current pathological fracture: Secondary | ICD-10-CM | POA: Diagnosis not present

## 2018-03-05 ENCOUNTER — Other Ambulatory Visit: Payer: Self-pay | Admitting: Internal Medicine

## 2018-03-05 DIAGNOSIS — I671 Cerebral aneurysm, nonruptured: Secondary | ICD-10-CM

## 2018-03-31 ENCOUNTER — Ambulatory Visit: Payer: PPO

## 2018-04-09 ENCOUNTER — Ambulatory Visit
Admission: RE | Admit: 2018-04-09 | Discharge: 2018-04-09 | Disposition: A | Discharge status: Home / Self Care | Payer: PPO | Source: Ambulatory Visit | Attending: Internal Medicine | Admitting: Internal Medicine

## 2018-04-09 DIAGNOSIS — I671 Cerebral aneurysm, nonruptured: Secondary | ICD-10-CM | POA: Insufficient documentation

## 2018-04-09 DIAGNOSIS — E119 Type 2 diabetes mellitus without complications: Secondary | ICD-10-CM | POA: Diagnosis not present

## 2018-04-09 DIAGNOSIS — R7989 Other specified abnormal findings of blood chemistry: Secondary | ICD-10-CM | POA: Diagnosis not present

## 2018-04-09 DIAGNOSIS — M8588 Other specified disorders of bone density and structure, other site: Secondary | ICD-10-CM | POA: Diagnosis not present

## 2018-04-09 DIAGNOSIS — I672 Cerebral atherosclerosis: Secondary | ICD-10-CM | POA: Diagnosis not present

## 2018-04-09 DIAGNOSIS — E782 Mixed hyperlipidemia: Secondary | ICD-10-CM | POA: Diagnosis not present

## 2018-04-16 DIAGNOSIS — E1151 Type 2 diabetes mellitus with diabetic peripheral angiopathy without gangrene: Secondary | ICD-10-CM | POA: Diagnosis not present

## 2018-04-16 DIAGNOSIS — E538 Deficiency of other specified B group vitamins: Secondary | ICD-10-CM | POA: Diagnosis not present

## 2018-04-16 DIAGNOSIS — Z0000 Encounter for general adult medical examination without abnormal findings: Secondary | ICD-10-CM | POA: Diagnosis not present

## 2018-05-21 DIAGNOSIS — D239 Other benign neoplasm of skin, unspecified: Secondary | ICD-10-CM | POA: Diagnosis not present

## 2018-05-21 DIAGNOSIS — L920 Granuloma annulare: Secondary | ICD-10-CM | POA: Diagnosis not present

## 2018-08-14 DIAGNOSIS — Z23 Encounter for immunization: Secondary | ICD-10-CM | POA: Diagnosis not present

## 2018-09-24 ENCOUNTER — Other Ambulatory Visit: Payer: Self-pay | Admitting: Internal Medicine

## 2018-09-24 DIAGNOSIS — Z1231 Encounter for screening mammogram for malignant neoplasm of breast: Secondary | ICD-10-CM

## 2018-10-21 ENCOUNTER — Ambulatory Visit
Admission: RE | Admit: 2018-10-21 | Discharge: 2018-10-21 | Disposition: A | Discharge status: Home / Self Care | Payer: PPO | Source: Ambulatory Visit | Attending: Internal Medicine | Admitting: Internal Medicine

## 2018-10-21 DIAGNOSIS — Z1231 Encounter for screening mammogram for malignant neoplasm of breast: Secondary | ICD-10-CM | POA: Insufficient documentation

## 2018-10-21 DIAGNOSIS — E538 Deficiency of other specified B group vitamins: Secondary | ICD-10-CM | POA: Diagnosis not present

## 2018-10-21 DIAGNOSIS — E1151 Type 2 diabetes mellitus with diabetic peripheral angiopathy without gangrene: Secondary | ICD-10-CM | POA: Diagnosis not present

## 2018-10-24 DIAGNOSIS — Z0000 Encounter for general adult medical examination without abnormal findings: Secondary | ICD-10-CM | POA: Diagnosis not present

## 2018-10-24 DIAGNOSIS — I63431 Cerebral infarction due to embolism of right posterior cerebral artery: Secondary | ICD-10-CM | POA: Diagnosis not present

## 2018-10-24 DIAGNOSIS — E1151 Type 2 diabetes mellitus with diabetic peripheral angiopathy without gangrene: Secondary | ICD-10-CM | POA: Diagnosis not present

## 2019-03-11 DIAGNOSIS — R1031 Right lower quadrant pain: Secondary | ICD-10-CM | POA: Diagnosis not present

## 2019-03-11 DIAGNOSIS — R1032 Left lower quadrant pain: Secondary | ICD-10-CM | POA: Diagnosis not present

## 2019-03-11 DIAGNOSIS — R350 Frequency of micturition: Secondary | ICD-10-CM | POA: Diagnosis not present

## 2019-03-11 DIAGNOSIS — R6883 Chills (without fever): Secondary | ICD-10-CM | POA: Diagnosis not present

## 2019-04-20 DIAGNOSIS — E1151 Type 2 diabetes mellitus with diabetic peripheral angiopathy without gangrene: Secondary | ICD-10-CM | POA: Diagnosis not present

## 2019-04-27 DIAGNOSIS — E782 Mixed hyperlipidemia: Secondary | ICD-10-CM | POA: Diagnosis not present

## 2019-04-27 DIAGNOSIS — E1151 Type 2 diabetes mellitus with diabetic peripheral angiopathy without gangrene: Secondary | ICD-10-CM | POA: Diagnosis not present

## 2019-04-27 DIAGNOSIS — I63431 Cerebral infarction due to embolism of right posterior cerebral artery: Secondary | ICD-10-CM | POA: Diagnosis not present

## 2019-05-12 ENCOUNTER — Other Ambulatory Visit: Payer: Self-pay | Admitting: Internal Medicine

## 2019-05-12 DIAGNOSIS — I671 Cerebral aneurysm, nonruptured: Secondary | ICD-10-CM

## 2019-05-26 ENCOUNTER — Other Ambulatory Visit: Payer: Self-pay

## 2019-05-26 ENCOUNTER — Ambulatory Visit
Admission: RE | Admit: 2019-05-26 | Discharge: 2019-05-26 | Disposition: A | Discharge status: Home / Self Care | Payer: PPO | Source: Ambulatory Visit | Attending: Internal Medicine | Admitting: Internal Medicine

## 2019-05-26 DIAGNOSIS — I671 Cerebral aneurysm, nonruptured: Secondary | ICD-10-CM | POA: Diagnosis not present

## 2019-07-22 DIAGNOSIS — Z23 Encounter for immunization: Secondary | ICD-10-CM | POA: Diagnosis not present

## 2019-10-05 ENCOUNTER — Other Ambulatory Visit: Payer: Self-pay | Admitting: Internal Medicine

## 2019-10-05 DIAGNOSIS — Z1231 Encounter for screening mammogram for malignant neoplasm of breast: Secondary | ICD-10-CM

## 2019-10-21 DIAGNOSIS — E1151 Type 2 diabetes mellitus with diabetic peripheral angiopathy without gangrene: Secondary | ICD-10-CM | POA: Diagnosis not present

## 2019-10-28 DIAGNOSIS — Z0000 Encounter for general adult medical examination without abnormal findings: Secondary | ICD-10-CM | POA: Diagnosis not present

## 2019-10-28 DIAGNOSIS — Z1212 Encounter for screening for malignant neoplasm of rectum: Secondary | ICD-10-CM | POA: Diagnosis not present

## 2019-10-28 DIAGNOSIS — E782 Mixed hyperlipidemia: Secondary | ICD-10-CM | POA: Diagnosis not present

## 2019-10-28 DIAGNOSIS — E1151 Type 2 diabetes mellitus with diabetic peripheral angiopathy without gangrene: Secondary | ICD-10-CM | POA: Diagnosis not present

## 2019-10-29 ENCOUNTER — Ambulatory Visit
Admission: RE | Admit: 2019-10-29 | Discharge: 2019-10-29 | Disposition: A | Discharge status: Home / Self Care | Payer: PPO | Source: Ambulatory Visit | Attending: Internal Medicine | Admitting: Internal Medicine

## 2019-10-29 DIAGNOSIS — Z1231 Encounter for screening mammogram for malignant neoplasm of breast: Secondary | ICD-10-CM | POA: Diagnosis not present

## 2020-02-18 DIAGNOSIS — E1151 Type 2 diabetes mellitus with diabetic peripheral angiopathy without gangrene: Secondary | ICD-10-CM | POA: Diagnosis not present

## 2020-02-18 DIAGNOSIS — M109 Gout, unspecified: Secondary | ICD-10-CM | POA: Diagnosis not present

## 2020-03-14 DIAGNOSIS — Z1212 Encounter for screening for malignant neoplasm of rectum: Secondary | ICD-10-CM | POA: Diagnosis not present

## 2020-03-14 DIAGNOSIS — Z1211 Encounter for screening for malignant neoplasm of colon: Secondary | ICD-10-CM | POA: Diagnosis not present

## 2020-04-20 DIAGNOSIS — E782 Mixed hyperlipidemia: Secondary | ICD-10-CM | POA: Diagnosis not present

## 2020-04-20 DIAGNOSIS — E1151 Type 2 diabetes mellitus with diabetic peripheral angiopathy without gangrene: Secondary | ICD-10-CM | POA: Diagnosis not present

## 2020-04-20 DIAGNOSIS — M109 Gout, unspecified: Secondary | ICD-10-CM | POA: Diagnosis not present

## 2020-04-20 DIAGNOSIS — Z79899 Other long term (current) drug therapy: Secondary | ICD-10-CM | POA: Diagnosis not present

## 2020-04-27 DIAGNOSIS — I63433 Cerebral infarction due to embolism of bilateral posterior cerebral arteries: Secondary | ICD-10-CM | POA: Diagnosis not present

## 2020-04-27 DIAGNOSIS — E1151 Type 2 diabetes mellitus with diabetic peripheral angiopathy without gangrene: Secondary | ICD-10-CM | POA: Diagnosis not present

## 2020-04-27 DIAGNOSIS — E782 Mixed hyperlipidemia: Secondary | ICD-10-CM | POA: Diagnosis not present

## 2020-04-27 DIAGNOSIS — I671 Cerebral aneurysm, nonruptured: Secondary | ICD-10-CM | POA: Diagnosis not present

## 2020-04-28 ENCOUNTER — Other Ambulatory Visit (HOSPITAL_COMMUNITY): Payer: Self-pay | Admitting: Internal Medicine

## 2020-04-28 ENCOUNTER — Other Ambulatory Visit: Payer: Self-pay | Admitting: Internal Medicine

## 2020-04-28 DIAGNOSIS — E1151 Type 2 diabetes mellitus with diabetic peripheral angiopathy without gangrene: Secondary | ICD-10-CM

## 2020-04-28 DIAGNOSIS — I671 Cerebral aneurysm, nonruptured: Secondary | ICD-10-CM

## 2020-05-09 DIAGNOSIS — R05 Cough: Secondary | ICD-10-CM | POA: Diagnosis not present

## 2020-05-09 DIAGNOSIS — J019 Acute sinusitis, unspecified: Secondary | ICD-10-CM | POA: Diagnosis not present

## 2020-05-09 DIAGNOSIS — Z20822 Contact with and (suspected) exposure to covid-19: Secondary | ICD-10-CM | POA: Diagnosis not present

## 2020-05-09 DIAGNOSIS — R0982 Postnasal drip: Secondary | ICD-10-CM | POA: Diagnosis not present

## 2020-05-13 ENCOUNTER — Ambulatory Visit: Payer: PPO

## 2020-05-31 ENCOUNTER — Ambulatory Visit
Admission: RE | Admit: 2020-05-31 | Discharge: 2020-05-31 | Disposition: A | Discharge status: Home / Self Care | Payer: PPO | Source: Ambulatory Visit | Attending: Internal Medicine | Admitting: Internal Medicine

## 2020-05-31 ENCOUNTER — Other Ambulatory Visit: Payer: Self-pay

## 2020-05-31 DIAGNOSIS — E1151 Type 2 diabetes mellitus with diabetic peripheral angiopathy without gangrene: Secondary | ICD-10-CM | POA: Insufficient documentation

## 2020-05-31 DIAGNOSIS — I671 Cerebral aneurysm, nonruptured: Secondary | ICD-10-CM | POA: Diagnosis not present

## 2020-05-31 DIAGNOSIS — I6601 Occlusion and stenosis of right middle cerebral artery: Secondary | ICD-10-CM | POA: Diagnosis not present

## 2020-08-09 DIAGNOSIS — Z23 Encounter for immunization: Secondary | ICD-10-CM | POA: Diagnosis not present

## 2020-09-27 DIAGNOSIS — Z20822 Contact with and (suspected) exposure to covid-19: Secondary | ICD-10-CM | POA: Diagnosis not present

## 2020-10-24 DIAGNOSIS — E782 Mixed hyperlipidemia: Secondary | ICD-10-CM | POA: Diagnosis not present

## 2020-10-24 DIAGNOSIS — E1151 Type 2 diabetes mellitus with diabetic peripheral angiopathy without gangrene: Secondary | ICD-10-CM | POA: Diagnosis not present

## 2020-10-28 DIAGNOSIS — E1151 Type 2 diabetes mellitus with diabetic peripheral angiopathy without gangrene: Secondary | ICD-10-CM | POA: Diagnosis not present

## 2020-10-28 DIAGNOSIS — Z0000 Encounter for general adult medical examination without abnormal findings: Secondary | ICD-10-CM | POA: Diagnosis not present

## 2020-10-28 DIAGNOSIS — E782 Mixed hyperlipidemia: Secondary | ICD-10-CM | POA: Diagnosis not present

## 2020-10-28 DIAGNOSIS — I63433 Cerebral infarction due to embolism of bilateral posterior cerebral arteries: Secondary | ICD-10-CM | POA: Diagnosis not present

## 2020-11-22 ENCOUNTER — Other Ambulatory Visit: Payer: Self-pay | Admitting: Internal Medicine

## 2020-11-22 DIAGNOSIS — Z1231 Encounter for screening mammogram for malignant neoplasm of breast: Secondary | ICD-10-CM

## 2020-11-23 ENCOUNTER — Other Ambulatory Visit: Payer: Self-pay

## 2020-11-23 ENCOUNTER — Ambulatory Visit
Admission: RE | Admit: 2020-11-23 | Discharge: 2020-11-23 | Disposition: A | Discharge status: Home / Self Care | Payer: PPO | Source: Ambulatory Visit | Attending: Internal Medicine | Admitting: Internal Medicine

## 2020-11-23 DIAGNOSIS — Z1231 Encounter for screening mammogram for malignant neoplasm of breast: Secondary | ICD-10-CM | POA: Diagnosis not present

## 2021-01-28 ENCOUNTER — Other Ambulatory Visit (HOSPITAL_COMMUNITY): Payer: Self-pay

## 2021-02-13 DIAGNOSIS — R300 Dysuria: Secondary | ICD-10-CM | POA: Diagnosis not present

## 2021-04-20 DIAGNOSIS — E1151 Type 2 diabetes mellitus with diabetic peripheral angiopathy without gangrene: Secondary | ICD-10-CM | POA: Diagnosis not present

## 2021-04-20 DIAGNOSIS — E782 Mixed hyperlipidemia: Secondary | ICD-10-CM | POA: Diagnosis not present

## 2021-04-27 DIAGNOSIS — I63431 Cerebral infarction due to embolism of right posterior cerebral artery: Secondary | ICD-10-CM | POA: Diagnosis not present

## 2021-04-27 DIAGNOSIS — E1151 Type 2 diabetes mellitus with diabetic peripheral angiopathy without gangrene: Secondary | ICD-10-CM | POA: Diagnosis not present

## 2021-04-27 DIAGNOSIS — E538 Deficiency of other specified B group vitamins: Secondary | ICD-10-CM | POA: Diagnosis not present

## 2021-04-27 DIAGNOSIS — N1832 Chronic kidney disease, stage 3b: Secondary | ICD-10-CM | POA: Diagnosis not present

## 2021-04-27 DIAGNOSIS — E782 Mixed hyperlipidemia: Secondary | ICD-10-CM | POA: Diagnosis not present

## 2021-06-20 DIAGNOSIS — J069 Acute upper respiratory infection, unspecified: Secondary | ICD-10-CM | POA: Diagnosis not present

## 2021-07-31 DIAGNOSIS — Z23 Encounter for immunization: Secondary | ICD-10-CM | POA: Diagnosis not present

## 2021-10-30 DIAGNOSIS — E1151 Type 2 diabetes mellitus with diabetic peripheral angiopathy without gangrene: Secondary | ICD-10-CM | POA: Diagnosis not present

## 2021-10-30 DIAGNOSIS — E538 Deficiency of other specified B group vitamins: Secondary | ICD-10-CM | POA: Diagnosis not present

## 2021-10-31 DIAGNOSIS — N1831 Chronic kidney disease, stage 3a: Secondary | ICD-10-CM | POA: Diagnosis not present

## 2021-10-31 DIAGNOSIS — Z0000 Encounter for general adult medical examination without abnormal findings: Secondary | ICD-10-CM | POA: Diagnosis not present

## 2021-10-31 DIAGNOSIS — I63431 Cerebral infarction due to embolism of right posterior cerebral artery: Secondary | ICD-10-CM | POA: Diagnosis not present

## 2021-10-31 DIAGNOSIS — E1151 Type 2 diabetes mellitus with diabetic peripheral angiopathy without gangrene: Secondary | ICD-10-CM | POA: Diagnosis not present

## 2022-01-15 ENCOUNTER — Other Ambulatory Visit: Payer: Self-pay | Admitting: Internal Medicine

## 2022-01-15 DIAGNOSIS — Z1231 Encounter for screening mammogram for malignant neoplasm of breast: Secondary | ICD-10-CM

## 2022-02-20 ENCOUNTER — Ambulatory Visit
Admission: RE | Admit: 2022-02-20 | Discharge: 2022-02-20 | Disposition: A | Discharge status: Home / Self Care | Payer: PPO | Source: Ambulatory Visit | Attending: Internal Medicine | Admitting: Internal Medicine

## 2022-02-20 DIAGNOSIS — Z1231 Encounter for screening mammogram for malignant neoplasm of breast: Secondary | ICD-10-CM | POA: Diagnosis not present

## 2022-04-30 DIAGNOSIS — E1151 Type 2 diabetes mellitus with diabetic peripheral angiopathy without gangrene: Secondary | ICD-10-CM | POA: Diagnosis not present

## 2022-04-30 DIAGNOSIS — R300 Dysuria: Secondary | ICD-10-CM | POA: Diagnosis not present

## 2022-05-01 DIAGNOSIS — R8290 Unspecified abnormal findings in urine: Secondary | ICD-10-CM | POA: Diagnosis not present

## 2022-05-07 DIAGNOSIS — E1151 Type 2 diabetes mellitus with diabetic peripheral angiopathy without gangrene: Secondary | ICD-10-CM | POA: Diagnosis not present

## 2022-05-07 DIAGNOSIS — I10 Essential (primary) hypertension: Secondary | ICD-10-CM | POA: Diagnosis not present

## 2022-07-30 DIAGNOSIS — Z23 Encounter for immunization: Secondary | ICD-10-CM | POA: Diagnosis not present

## 2022-11-14 DIAGNOSIS — I10 Essential (primary) hypertension: Secondary | ICD-10-CM | POA: Diagnosis not present

## 2022-11-14 DIAGNOSIS — E1151 Type 2 diabetes mellitus with diabetic peripheral angiopathy without gangrene: Secondary | ICD-10-CM | POA: Diagnosis not present

## 2022-11-14 DIAGNOSIS — Z79899 Other long term (current) drug therapy: Secondary | ICD-10-CM | POA: Diagnosis not present

## 2022-11-21 ENCOUNTER — Other Ambulatory Visit: Payer: Self-pay | Admitting: Internal Medicine

## 2022-11-21 DIAGNOSIS — I671 Cerebral aneurysm, nonruptured: Secondary | ICD-10-CM | POA: Diagnosis not present

## 2022-11-21 DIAGNOSIS — N1831 Chronic kidney disease, stage 3a: Secondary | ICD-10-CM | POA: Diagnosis not present

## 2022-11-21 DIAGNOSIS — I63431 Cerebral infarction due to embolism of right posterior cerebral artery: Secondary | ICD-10-CM | POA: Diagnosis not present

## 2022-11-21 DIAGNOSIS — E782 Mixed hyperlipidemia: Secondary | ICD-10-CM | POA: Diagnosis not present

## 2022-11-21 DIAGNOSIS — Z0000 Encounter for general adult medical examination without abnormal findings: Secondary | ICD-10-CM | POA: Diagnosis not present

## 2022-11-21 DIAGNOSIS — E1151 Type 2 diabetes mellitus with diabetic peripheral angiopathy without gangrene: Secondary | ICD-10-CM | POA: Diagnosis not present

## 2022-11-27 ENCOUNTER — Ambulatory Visit
Admission: RE | Admit: 2022-11-27 | Discharge: 2022-11-27 | Disposition: A | Discharge status: Home / Self Care | Payer: PPO | Source: Ambulatory Visit | Attending: Internal Medicine | Admitting: Internal Medicine

## 2022-11-27 DIAGNOSIS — I671 Cerebral aneurysm, nonruptured: Secondary | ICD-10-CM | POA: Insufficient documentation

## 2022-11-30 DIAGNOSIS — H35373 Puckering of macula, bilateral: Secondary | ICD-10-CM | POA: Diagnosis not present

## 2023-01-13 IMAGING — MG MM DIGITAL SCREENING BILAT W/ TOMO AND CAD
8 series · 8 of 24 positions shown · non-contrast
Comparison: None Available.

CLINICAL DATA: Screening.

EXAM:
DIGITAL SCREENING BILATERAL MAMMOGRAM WITH TOMOSYNTHESIS AND CAD
TECHNIQUE: Bilateral screening digital craniocaudal and mediolateral oblique
mammograms were obtained. Bilateral screening digital breast
tomosynthesis was performed. The images were evaluated with
computer-aided detection.

[R MLO synth-2D]
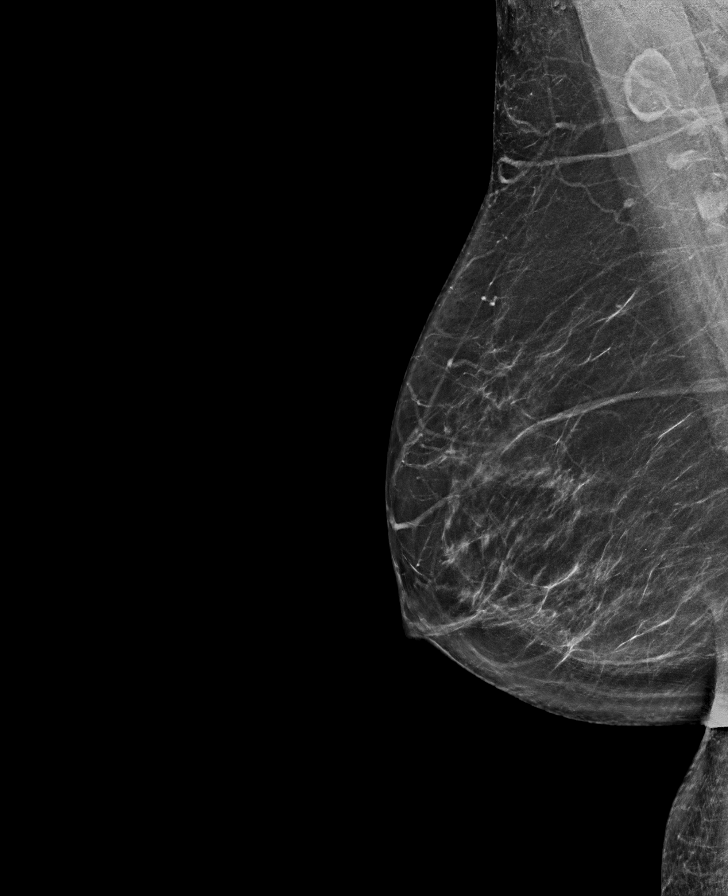

[L MLO synth-2D]
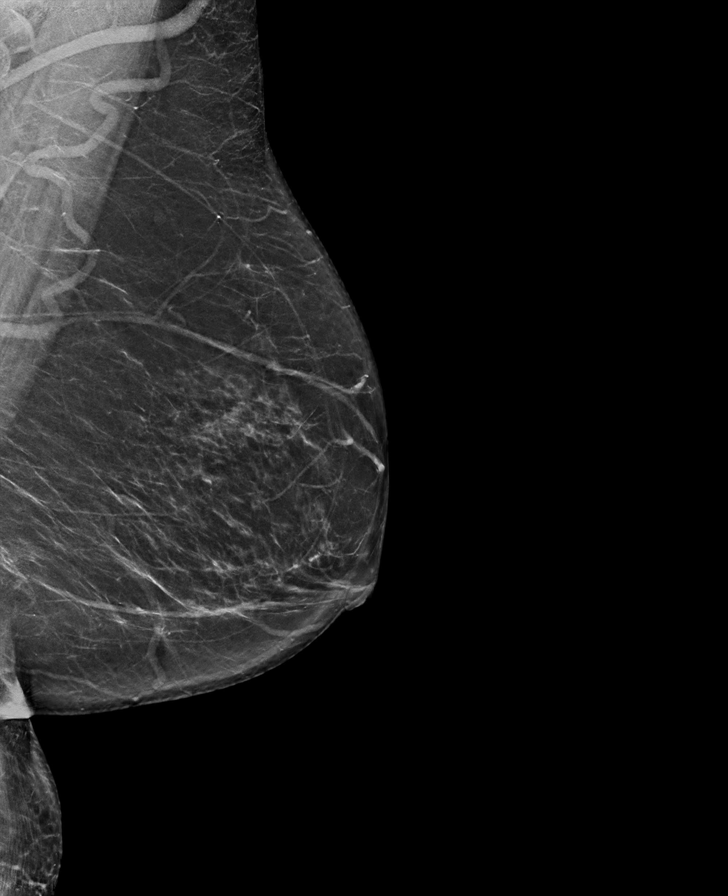

[R CC synth-2D]
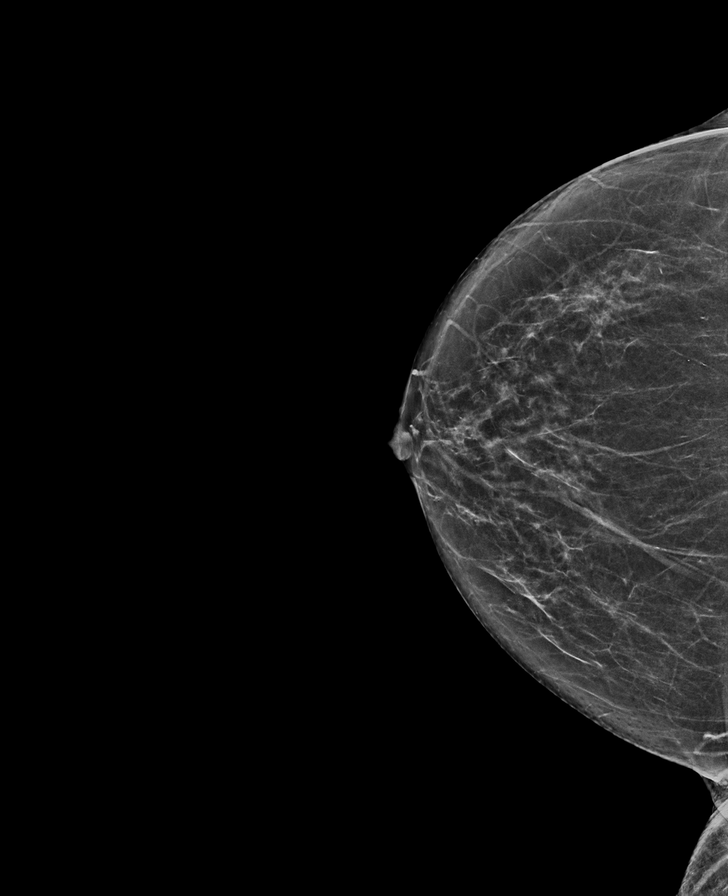

[L CC synth-2D]
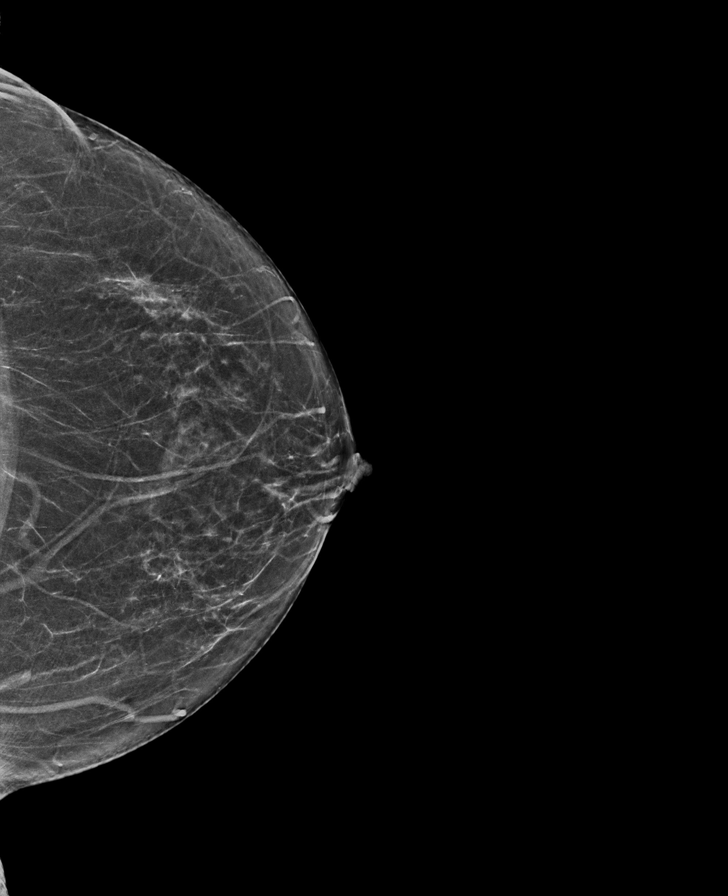

[R CC tomo · tomo slice 31/62.0]
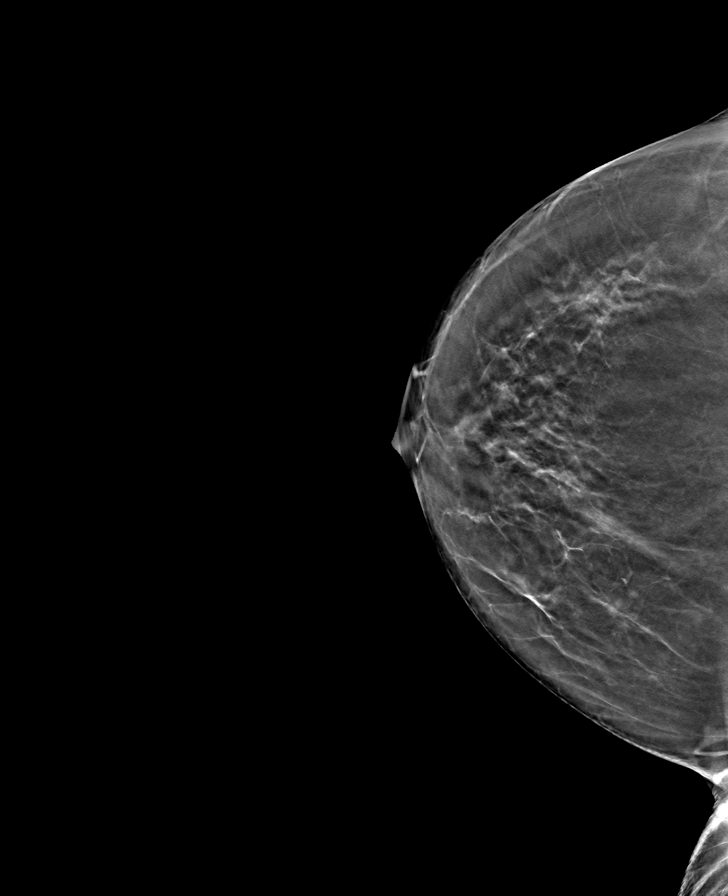

[R MLO tomo · tomo slice 35/70.0]
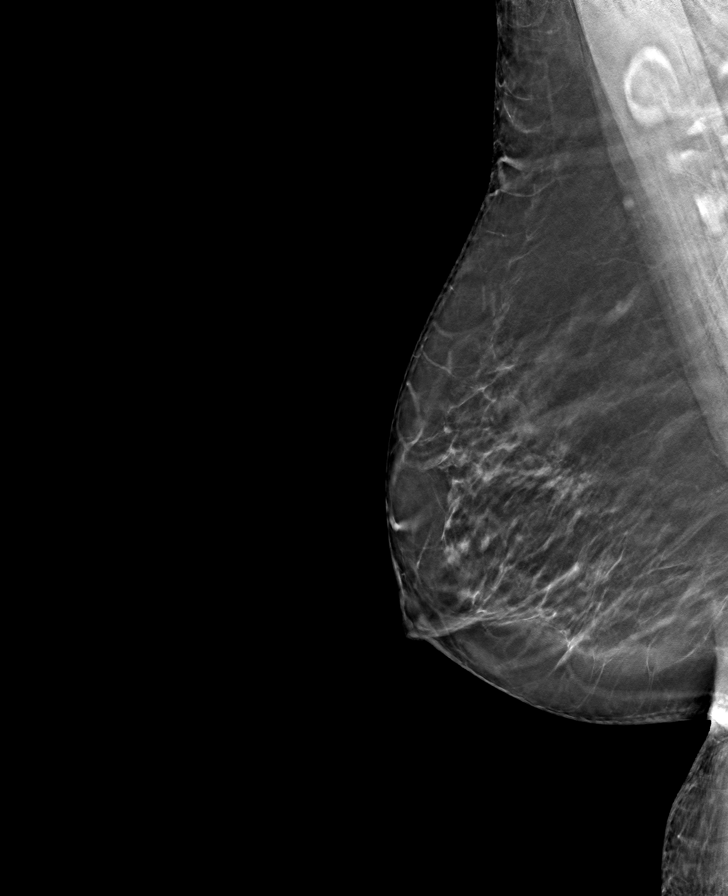

[L MLO tomo · tomo slice 36/71.0]
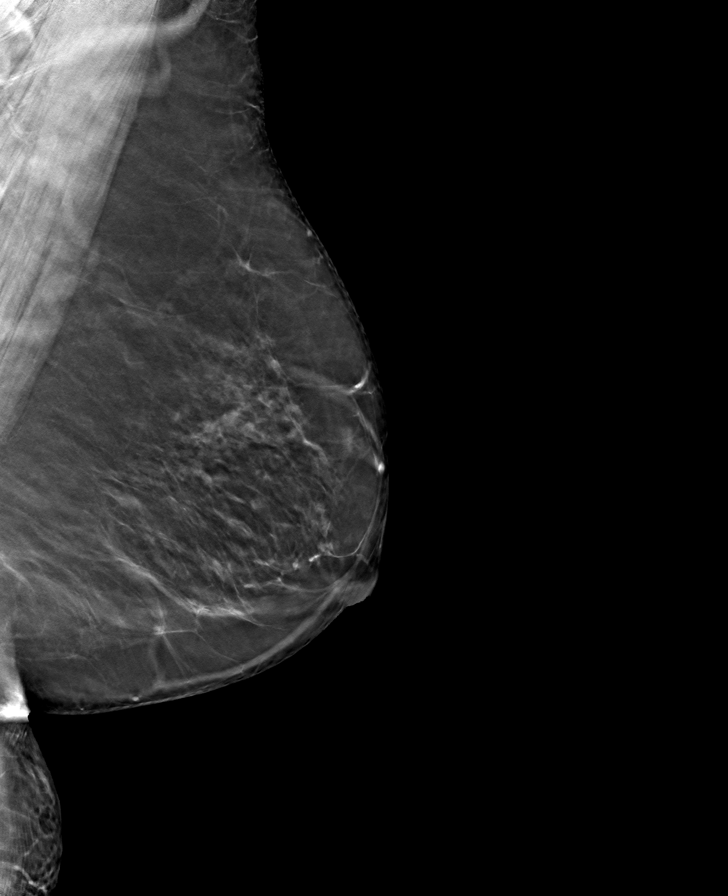

[L CC tomo · tomo slice 31/62.0]
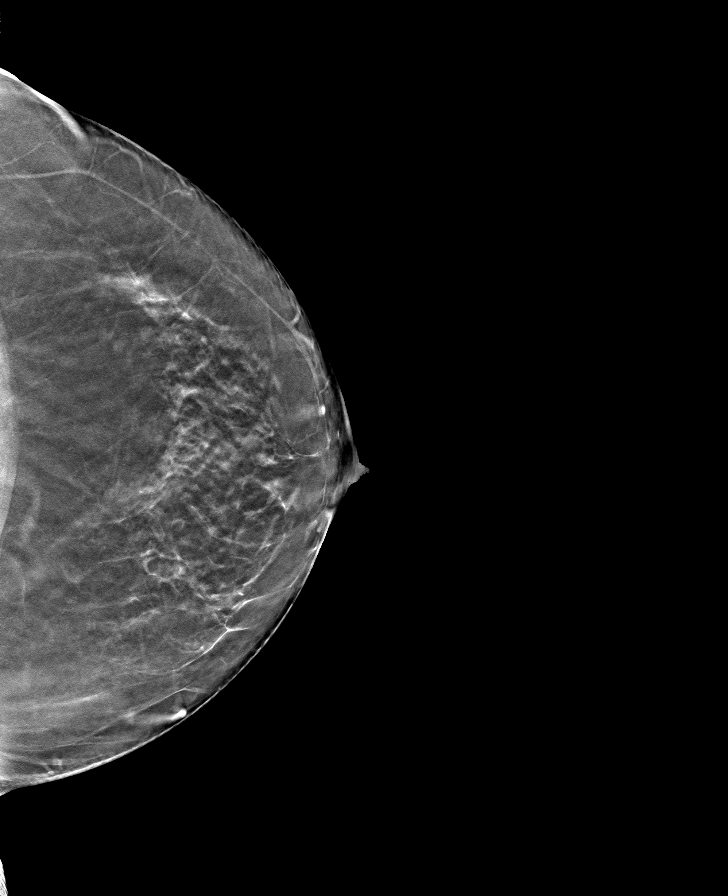

[8 of 24 positions shown; findings below may reference images not displayed]

ACR Breast Density Category c: The breast tissue is heterogeneously
dense, which may obscure small masses.
FINDINGS: There are no findings suspicious for malignancy.
IMPRESSION: No mammographic evidence of malignancy. A result letter of this
screening mammogram will be mailed directly to the patient.

RECOMMENDATION:
Screening mammogram in one year. (Code:BD-A-CHL)

BI-RADS CATEGORY  1: Negative.

## 2023-05-15 DIAGNOSIS — Z79899 Other long term (current) drug therapy: Secondary | ICD-10-CM | POA: Diagnosis not present

## 2023-05-15 DIAGNOSIS — E1151 Type 2 diabetes mellitus with diabetic peripheral angiopathy without gangrene: Secondary | ICD-10-CM | POA: Diagnosis not present

## 2023-05-15 DIAGNOSIS — E782 Mixed hyperlipidemia: Secondary | ICD-10-CM | POA: Diagnosis not present

## 2023-05-22 DIAGNOSIS — M5010 Cervical disc disorder with radiculopathy, unspecified cervical region: Secondary | ICD-10-CM | POA: Diagnosis not present

## 2023-05-22 DIAGNOSIS — E1151 Type 2 diabetes mellitus with diabetic peripheral angiopathy without gangrene: Secondary | ICD-10-CM | POA: Diagnosis not present

## 2023-05-22 DIAGNOSIS — I671 Cerebral aneurysm, nonruptured: Secondary | ICD-10-CM | POA: Diagnosis not present

## 2023-05-22 DIAGNOSIS — M7912 Myalgia of auxiliary muscles, head and neck: Secondary | ICD-10-CM | POA: Diagnosis not present

## 2023-07-17 ENCOUNTER — Other Ambulatory Visit: Payer: Self-pay | Admitting: Internal Medicine

## 2023-07-17 DIAGNOSIS — Z1231 Encounter for screening mammogram for malignant neoplasm of breast: Secondary | ICD-10-CM

## 2023-07-17 DIAGNOSIS — Z23 Encounter for immunization: Secondary | ICD-10-CM | POA: Diagnosis not present

## 2023-07-18 ENCOUNTER — Ambulatory Visit
Admission: RE | Admit: 2023-07-18 | Discharge: 2023-07-18 | Disposition: A | Discharge status: Home / Self Care | Payer: PPO | Source: Ambulatory Visit | Attending: Internal Medicine | Admitting: Internal Medicine

## 2023-07-18 DIAGNOSIS — Z1231 Encounter for screening mammogram for malignant neoplasm of breast: Secondary | ICD-10-CM | POA: Insufficient documentation

## 2023-11-26 DIAGNOSIS — E1151 Type 2 diabetes mellitus with diabetic peripheral angiopathy without gangrene: Secondary | ICD-10-CM | POA: Diagnosis not present

## 2023-11-26 DIAGNOSIS — Z79899 Other long term (current) drug therapy: Secondary | ICD-10-CM | POA: Diagnosis not present

## 2023-11-26 DIAGNOSIS — E782 Mixed hyperlipidemia: Secondary | ICD-10-CM | POA: Diagnosis not present

## 2023-12-03 DIAGNOSIS — E1151 Type 2 diabetes mellitus with diabetic peripheral angiopathy without gangrene: Secondary | ICD-10-CM | POA: Diagnosis not present

## 2023-12-03 DIAGNOSIS — N1831 Chronic kidney disease, stage 3a: Secondary | ICD-10-CM | POA: Diagnosis not present

## 2023-12-03 DIAGNOSIS — I671 Cerebral aneurysm, nonruptured: Secondary | ICD-10-CM | POA: Diagnosis not present

## 2023-12-03 DIAGNOSIS — Z0000 Encounter for general adult medical examination without abnormal findings: Secondary | ICD-10-CM | POA: Diagnosis not present

## 2023-12-03 DIAGNOSIS — E782 Mixed hyperlipidemia: Secondary | ICD-10-CM | POA: Diagnosis not present

## 2024-05-26 DIAGNOSIS — E1151 Type 2 diabetes mellitus with diabetic peripheral angiopathy without gangrene: Secondary | ICD-10-CM | POA: Diagnosis not present

## 2024-05-26 DIAGNOSIS — Z79899 Other long term (current) drug therapy: Secondary | ICD-10-CM | POA: Diagnosis not present

## 2024-06-02 DIAGNOSIS — E1151 Type 2 diabetes mellitus with diabetic peripheral angiopathy without gangrene: Secondary | ICD-10-CM | POA: Diagnosis not present

## 2024-06-02 DIAGNOSIS — Z79899 Other long term (current) drug therapy: Secondary | ICD-10-CM | POA: Diagnosis not present

## 2024-06-02 DIAGNOSIS — N1831 Chronic kidney disease, stage 3a: Secondary | ICD-10-CM | POA: Diagnosis not present

## 2024-06-02 DIAGNOSIS — M1711 Unilateral primary osteoarthritis, right knee: Secondary | ICD-10-CM | POA: Diagnosis not present

## 2024-06-02 DIAGNOSIS — I63431 Cerebral infarction due to embolism of right posterior cerebral artery: Secondary | ICD-10-CM | POA: Diagnosis not present

## 2024-06-02 DIAGNOSIS — N1832 Chronic kidney disease, stage 3b: Secondary | ICD-10-CM | POA: Diagnosis not present

## 2024-06-10 ENCOUNTER — Other Ambulatory Visit: Payer: Self-pay | Admitting: Internal Medicine

## 2024-06-10 DIAGNOSIS — Z1231 Encounter for screening mammogram for malignant neoplasm of breast: Secondary | ICD-10-CM

## 2024-07-21 ENCOUNTER — Ambulatory Visit
Admission: RE | Admit: 2024-07-21 | Discharge: 2024-07-21 | Disposition: A | Discharge status: Home / Self Care | Source: Ambulatory Visit | Attending: Internal Medicine | Admitting: Internal Medicine

## 2024-07-21 DIAGNOSIS — Z1231 Encounter for screening mammogram for malignant neoplasm of breast: Secondary | ICD-10-CM | POA: Diagnosis not present

## 2024-08-10 DIAGNOSIS — Z23 Encounter for immunization: Secondary | ICD-10-CM | POA: Diagnosis not present

## 2024-08-21 DIAGNOSIS — H35373 Puckering of macula, bilateral: Secondary | ICD-10-CM | POA: Diagnosis not present

## 2024-08-21 DIAGNOSIS — H43813 Vitreous degeneration, bilateral: Secondary | ICD-10-CM | POA: Diagnosis not present

## 2024-08-21 DIAGNOSIS — E119 Type 2 diabetes mellitus without complications: Secondary | ICD-10-CM | POA: Diagnosis not present

## 2024-08-21 DIAGNOSIS — H2513 Age-related nuclear cataract, bilateral: Secondary | ICD-10-CM | POA: Diagnosis not present
